# Patient Record
Sex: Female | Born: 1965 | Race: White | Hispanic: No | Smoking: Never smoker
Health system: Southern US, Community
[De-identification: ages and names within clinical notes are randomized; demographics above are authoritative.]

## PROBLEM LIST (undated history)

## (undated) DIAGNOSIS — F419 Anxiety disorder, unspecified: Secondary | ICD-10-CM

## (undated) DIAGNOSIS — G43909 Migraine, unspecified, not intractable, without status migrainosus: Secondary | ICD-10-CM

---

## 1998-07-17 ENCOUNTER — Emergency Department (HOSPITAL_COMMUNITY): Admission: EM | Admit: 1998-07-17 | Discharge: 1998-07-17 | Payer: Self-pay | Admitting: Emergency Medicine

## 1999-07-03 ENCOUNTER — Emergency Department (HOSPITAL_COMMUNITY): Admission: EM | Admit: 1999-07-03 | Discharge: 1999-07-04 | Payer: Self-pay | Admitting: Emergency Medicine

## 1999-08-14 ENCOUNTER — Emergency Department (HOSPITAL_COMMUNITY): Admission: EM | Admit: 1999-08-14 | Discharge: 1999-08-14 | Payer: Self-pay | Admitting: Emergency Medicine

## 1999-09-01 ENCOUNTER — Encounter: Admission: RE | Admit: 1999-09-01 | Discharge: 1999-09-01 | Payer: Self-pay | Admitting: Urology

## 2000-05-04 ENCOUNTER — Encounter: Payer: Self-pay | Admitting: Family Medicine

## 2000-05-04 ENCOUNTER — Encounter: Admission: RE | Admit: 2000-05-04 | Discharge: 2000-05-04 | Payer: Self-pay | Admitting: Family Medicine

## 2000-11-03 ENCOUNTER — Other Ambulatory Visit: Admission: RE | Admit: 2000-11-03 | Discharge: 2000-11-03 | Payer: Self-pay | Admitting: Family Medicine

## 2000-12-13 ENCOUNTER — Other Ambulatory Visit: Admission: RE | Admit: 2000-12-13 | Discharge: 2000-12-13 | Payer: Self-pay | Admitting: Obstetrics and Gynecology

## 2000-12-13 ENCOUNTER — Encounter (INDEPENDENT_AMBULATORY_CARE_PROVIDER_SITE_OTHER): Payer: Self-pay | Admitting: Specialist

## 2002-03-17 ENCOUNTER — Emergency Department (HOSPITAL_COMMUNITY): Admission: EM | Admit: 2002-03-17 | Discharge: 2002-03-17 | Payer: Self-pay

## 2003-02-22 ENCOUNTER — Emergency Department (HOSPITAL_COMMUNITY): Admission: EM | Admit: 2003-02-22 | Discharge: 2003-02-22 | Payer: Self-pay | Admitting: Emergency Medicine

## 2003-07-17 ENCOUNTER — Emergency Department (HOSPITAL_COMMUNITY): Admission: EM | Admit: 2003-07-17 | Discharge: 2003-07-17 | Payer: Self-pay | Admitting: Emergency Medicine

## 2003-07-20 ENCOUNTER — Emergency Department (HOSPITAL_COMMUNITY): Admission: EM | Admit: 2003-07-20 | Discharge: 2003-07-20 | Payer: Self-pay | Admitting: Emergency Medicine

## 2004-02-06 ENCOUNTER — Emergency Department (HOSPITAL_COMMUNITY): Admission: EM | Admit: 2004-02-06 | Discharge: 2004-02-06 | Payer: Self-pay | Admitting: Emergency Medicine

## 2004-08-04 ENCOUNTER — Emergency Department (HOSPITAL_COMMUNITY): Admission: EM | Admit: 2004-08-04 | Discharge: 2004-08-04 | Payer: Self-pay | Admitting: Emergency Medicine

## 2005-01-21 ENCOUNTER — Emergency Department (HOSPITAL_COMMUNITY): Admission: EM | Admit: 2005-01-21 | Discharge: 2005-01-21 | Payer: Self-pay | Admitting: Emergency Medicine

## 2005-02-21 ENCOUNTER — Emergency Department (HOSPITAL_COMMUNITY): Admission: EM | Admit: 2005-02-21 | Discharge: 2005-02-21 | Payer: Self-pay | Admitting: Emergency Medicine

## 2006-03-11 ENCOUNTER — Emergency Department (HOSPITAL_COMMUNITY): Admission: EM | Admit: 2006-03-11 | Discharge: 2006-03-11 | Payer: Self-pay | Admitting: Emergency Medicine

## 2006-11-25 ENCOUNTER — Emergency Department (HOSPITAL_COMMUNITY): Admission: EM | Admit: 2006-11-25 | Discharge: 2006-11-25 | Payer: Self-pay | Admitting: Emergency Medicine

## 2006-11-27 ENCOUNTER — Inpatient Hospital Stay (HOSPITAL_COMMUNITY): Admission: AD | Admit: 2006-11-27 | Discharge: 2006-11-27 | Payer: Self-pay | Admitting: Obstetrics and Gynecology

## 2006-11-27 ENCOUNTER — Encounter (INDEPENDENT_AMBULATORY_CARE_PROVIDER_SITE_OTHER): Payer: Self-pay | Admitting: Specialist

## 2006-11-27 ENCOUNTER — Ambulatory Visit: Payer: Self-pay | Admitting: Obstetrics & Gynecology

## 2007-01-29 ENCOUNTER — Emergency Department (HOSPITAL_COMMUNITY): Admission: EM | Admit: 2007-01-29 | Discharge: 2007-01-29 | Payer: Self-pay | Admitting: Emergency Medicine

## 2007-02-01 ENCOUNTER — Emergency Department (HOSPITAL_COMMUNITY): Admission: EM | Admit: 2007-02-01 | Discharge: 2007-02-01 | Payer: Self-pay | Admitting: Emergency Medicine

## 2007-04-25 ENCOUNTER — Emergency Department (HOSPITAL_COMMUNITY): Admission: EM | Admit: 2007-04-25 | Discharge: 2007-04-25 | Payer: Self-pay | Admitting: Emergency Medicine

## 2007-06-24 ENCOUNTER — Emergency Department (HOSPITAL_COMMUNITY): Admission: EM | Admit: 2007-06-24 | Discharge: 2007-06-24 | Payer: Self-pay | Admitting: *Deleted

## 2007-08-24 ENCOUNTER — Encounter (INDEPENDENT_AMBULATORY_CARE_PROVIDER_SITE_OTHER): Payer: Self-pay | Admitting: Obstetrics and Gynecology

## 2007-08-24 ENCOUNTER — Ambulatory Visit: Payer: Self-pay | Admitting: Vascular Surgery

## 2007-08-24 ENCOUNTER — Ambulatory Visit (HOSPITAL_COMMUNITY): Admission: RE | Admit: 2007-08-24 | Discharge: 2007-08-24 | Payer: Self-pay | Admitting: Obstetrics and Gynecology

## 2007-12-14 ENCOUNTER — Inpatient Hospital Stay (HOSPITAL_COMMUNITY): Admission: AD | Admit: 2007-12-14 | Discharge: 2007-12-14 | Payer: Self-pay | Admitting: Obstetrics and Gynecology

## 2007-12-15 ENCOUNTER — Inpatient Hospital Stay (HOSPITAL_COMMUNITY): Admission: AD | Admit: 2007-12-15 | Discharge: 2007-12-20 | Payer: Self-pay | Admitting: Obstetrics and Gynecology

## 2007-12-18 ENCOUNTER — Encounter (HOSPITAL_COMMUNITY): Payer: Self-pay | Admitting: Obstetrics and Gynecology

## 2008-01-25 ENCOUNTER — Ambulatory Visit (HOSPITAL_COMMUNITY): Admission: RE | Admit: 2008-01-25 | Discharge: 2008-01-25 | Payer: Self-pay | Admitting: Obstetrics and Gynecology

## 2008-02-01 ENCOUNTER — Ambulatory Visit: Payer: Self-pay | Admitting: Cardiology

## 2008-03-17 ENCOUNTER — Emergency Department (HOSPITAL_COMMUNITY): Admission: EM | Admit: 2008-03-17 | Discharge: 2008-03-17 | Payer: Self-pay | Admitting: Emergency Medicine

## 2009-01-09 ENCOUNTER — Emergency Department (HOSPITAL_COMMUNITY): Admission: EM | Admit: 2009-01-09 | Discharge: 2009-01-09 | Payer: Self-pay | Admitting: Emergency Medicine

## 2009-05-07 IMAGING — US US OB COMP +14 WK
1 series · 14 of 28 positions shown · non-contrast
Comparison: none

OBSTETRICAL ULTRASOUND:

 This ultrasound exam was performed in the [HOSPITAL] Ultrasound Department.  The OB US report was generated in the AS system, and faxed to the ordering physician.  This report is also available in [REDACTED] PACS.

[Series 1: us fetal bpp w/o nonstress · non-contrast · 14 of 34 slices shown]
[im 2/34]
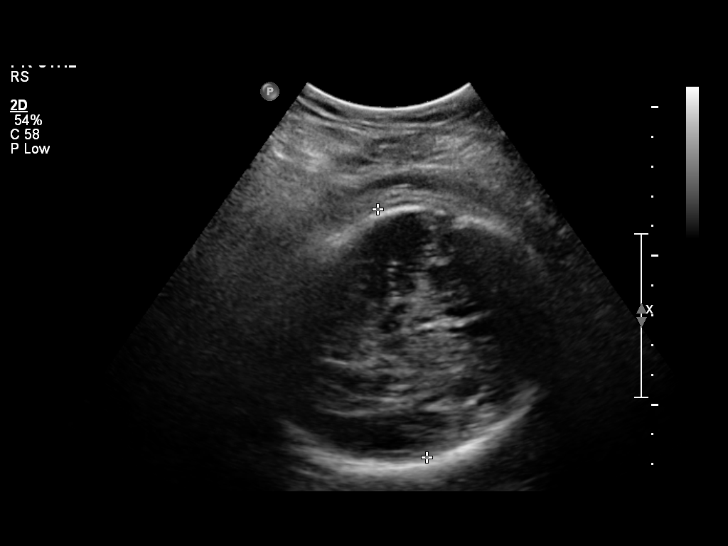
[im 4/34]
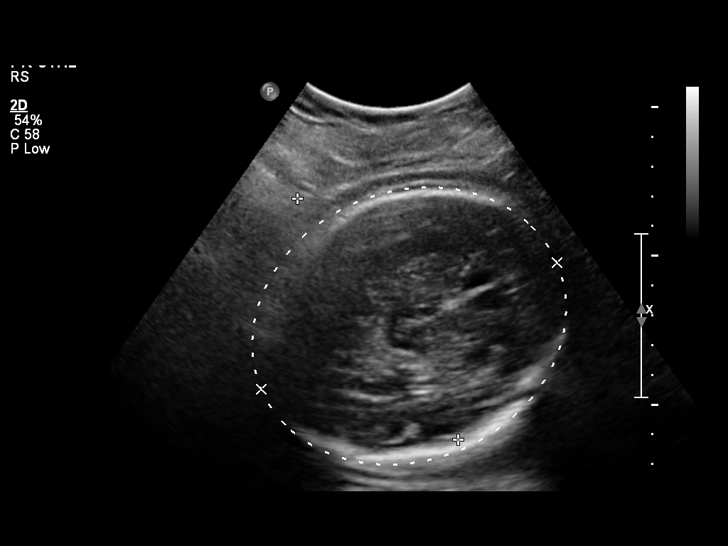
[im 7/34]
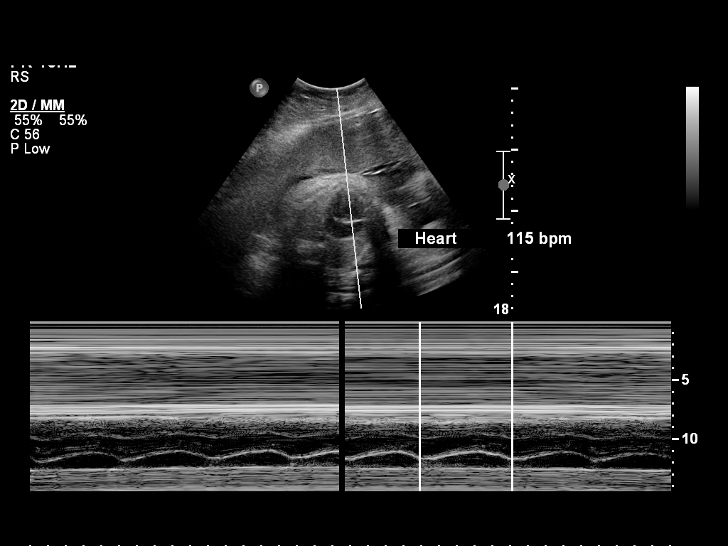
[im 9/34]
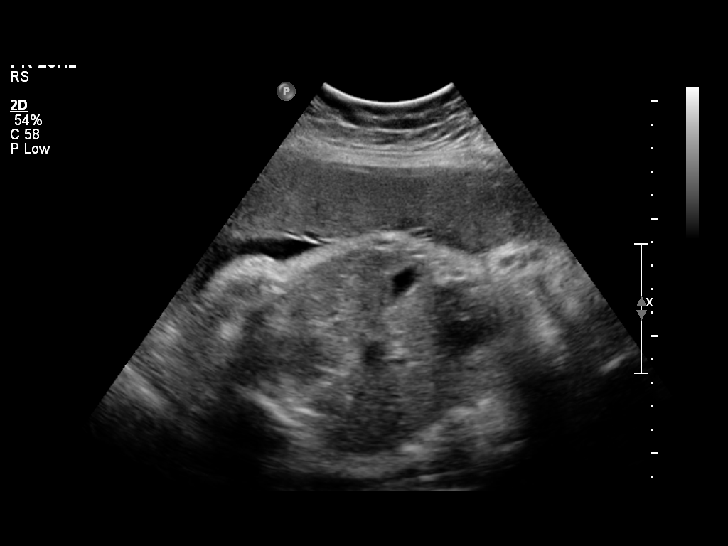
[im 12/34]
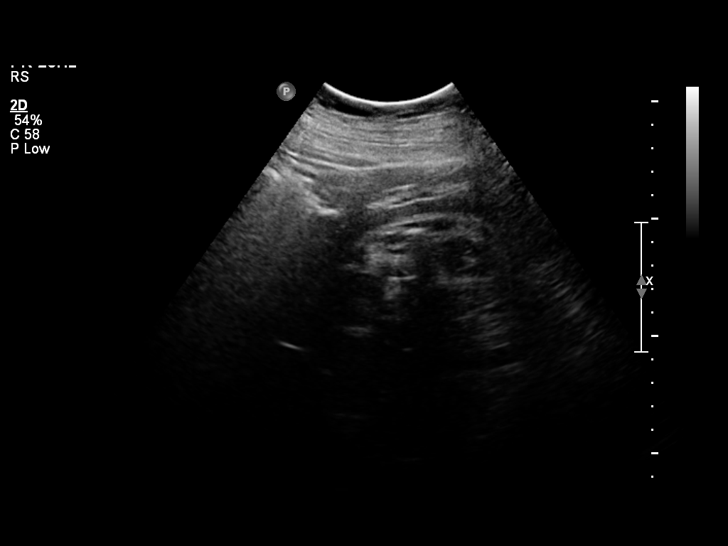
[im 14/34]
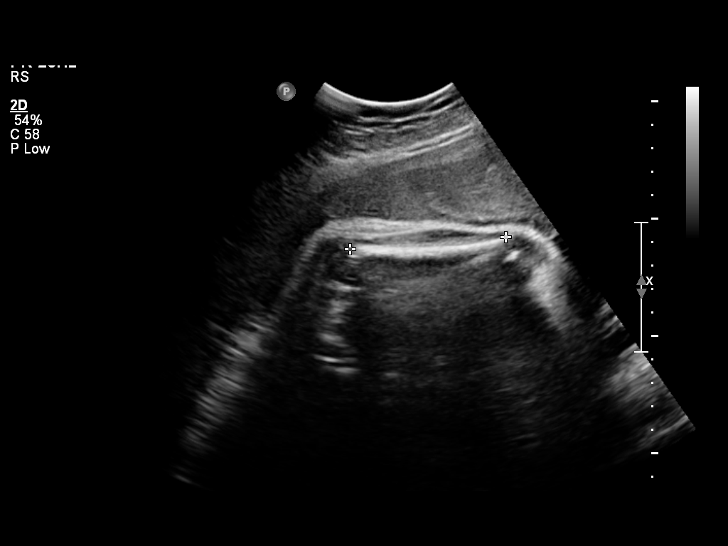
[im 16/34]
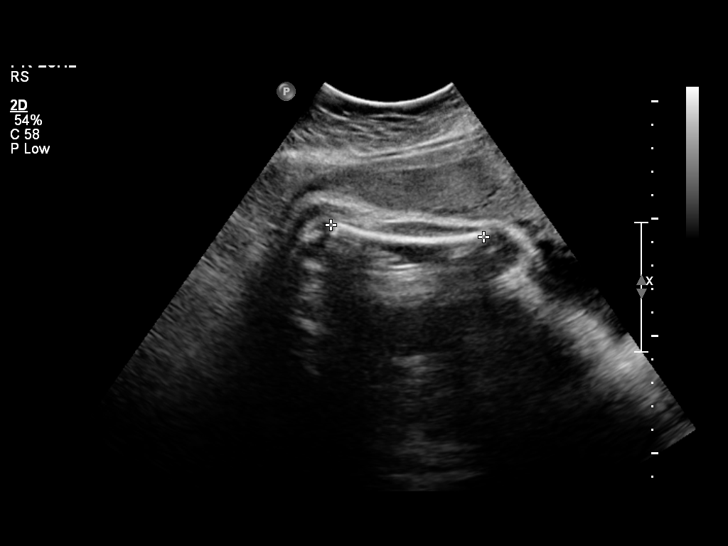
[im 19/34]
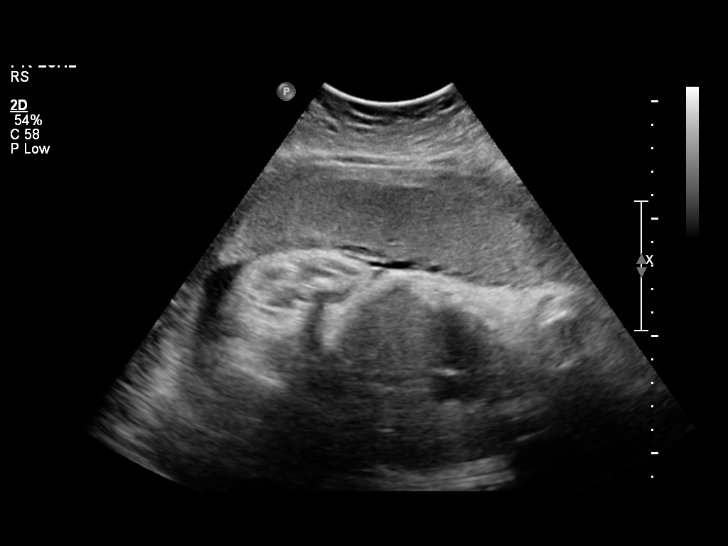
[im 21/34]
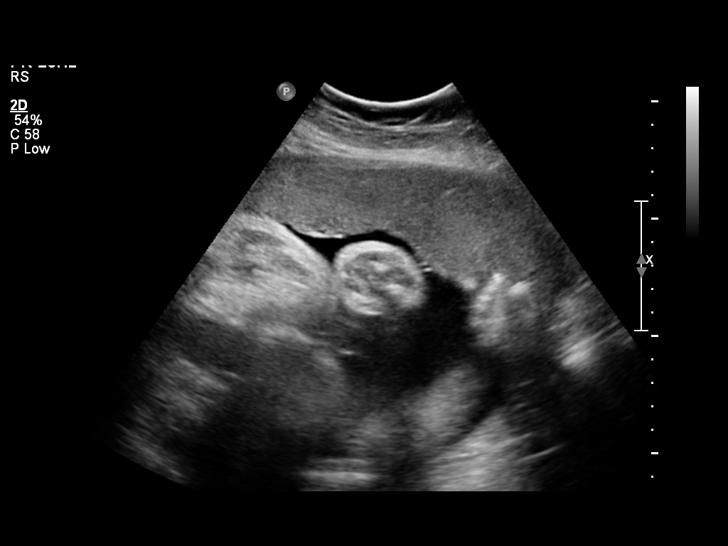
[im 24/34]
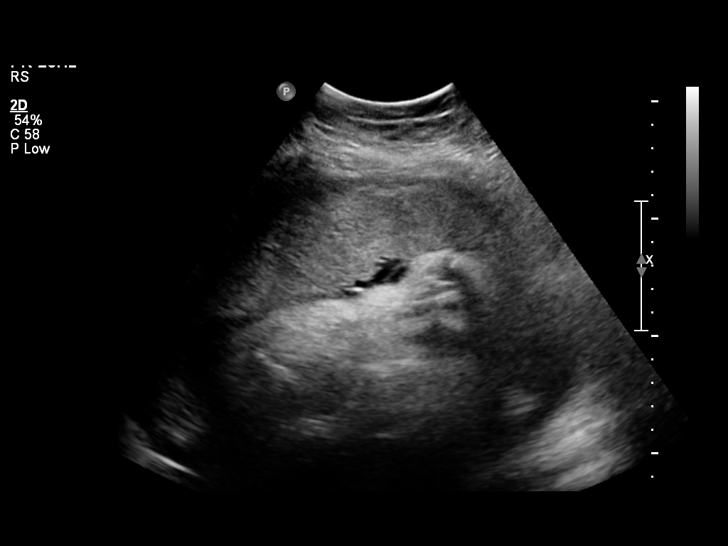
[im 26/34]
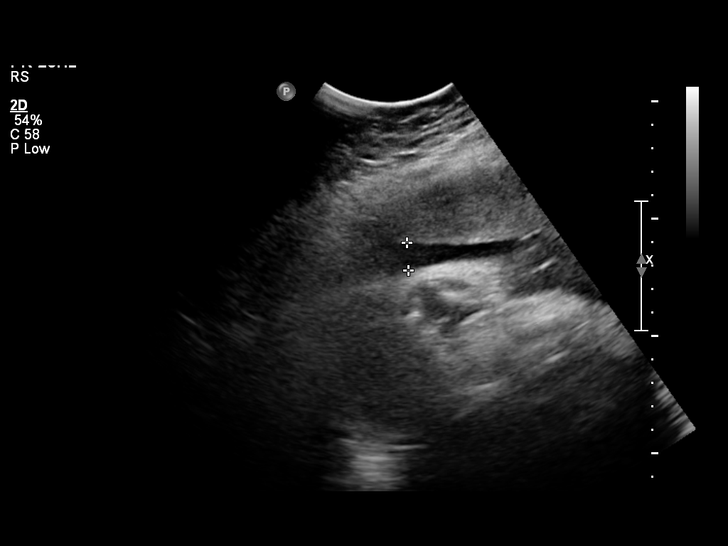
[im 29/34]
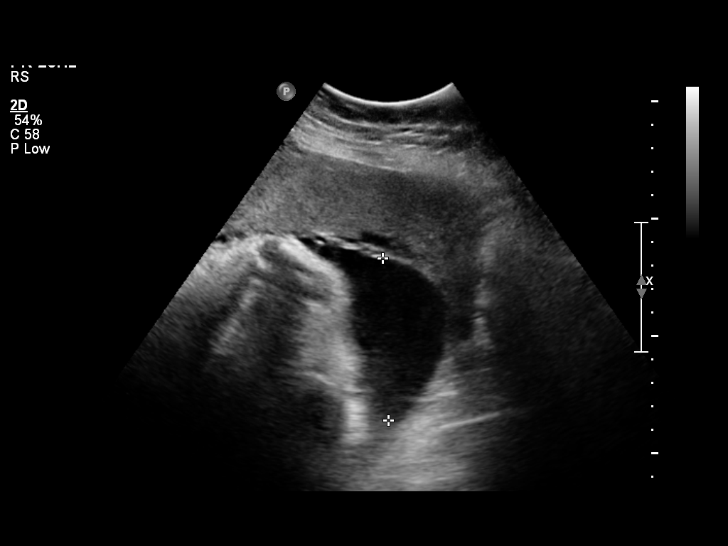
[im 31/34]
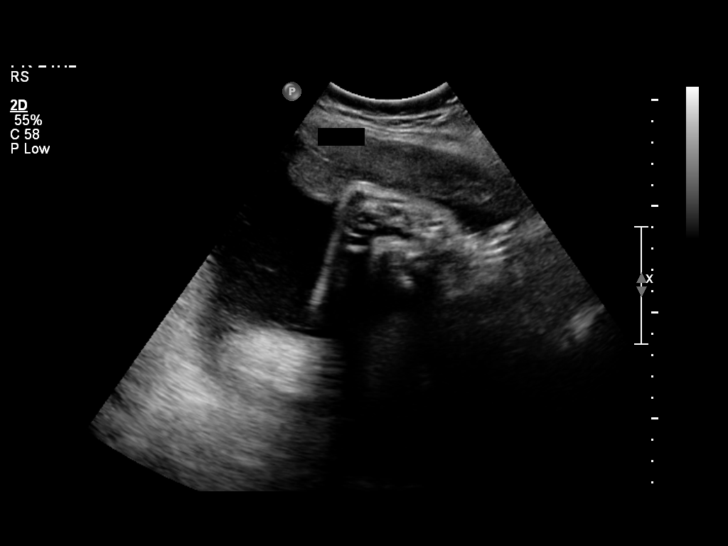
[im 34/34]
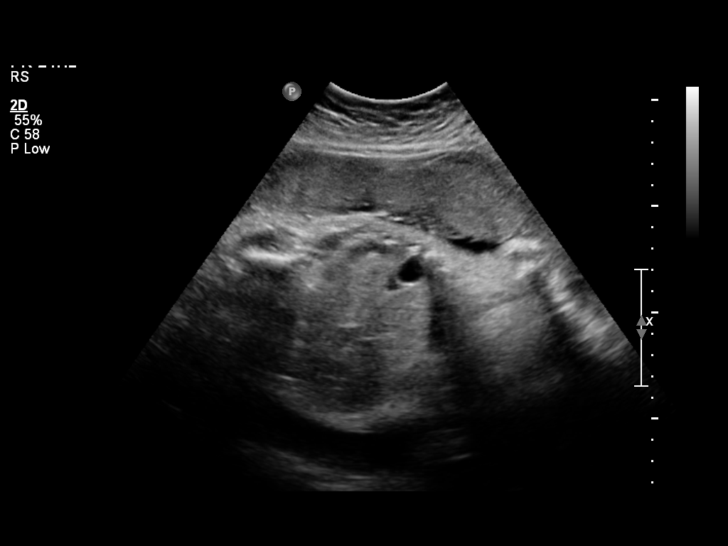

[14 of 28 positions shown; findings below may reference images not displayed]

IMPRESSION: See AS Obstetric US report.

## 2011-04-05 NOTE — Op Note (Signed)
Samantha Wolfe, HEACOX          ACCOUNT NO.:  [id number]   MEDICAL RECORD NO.:  [id number]          PATIENT TYPE:  AMB   LOCATION:  SDC                           FACILITY:  [facility]   PHYSICIAN:  Juluis Mire, M.D.   DATE OF BIRTH:  01-28-1966   DATE OF PROCEDURE:  01/25/2008  DATE OF DISCHARGE:  01/25/2008                               OPERATIVE REPORT   PREOPERATIVE DIAGNOSIS:  Multiparity, desires sterility.   POSTOPERATIVE DIAGNOSIS:  Multiparity, desires sterility.   OPERATIVE PROCEDURE:  Laparoscopy with bilateral tubal fulguration.   SURGEON:  Juluis Mire, M.D.   ANESTHESIA:  General.   ESTIMATED BLOOD LOSS:  Minimal.   PACKS AND DRAINS:  None.   INTRAOPERATIVE BLOOD REPLACEMENT:  None.   COMPLICATIONS:  None.   INDICATIONS:  The indications are dictated in history and physical.   PROCEDURE:  The patient was taken to the OR and placed in a supine  position.  After satisfactory level of general endotracheal anesthesia  was obtained, the patient was placed in the dorsal lithotomy position  using the Allen stirrups.  Perineum and vagina were cleansed out with  Betadine.  Bladder was emptied by in-and-out catheterization.  A Hulka  tacked was put in place and secured.  The patient was draped as a  sterile field.  A subumbilical incision was made with a knife.  A Veress  needle was introduced into the abdominal cavity.  Abdomen was  insufflated with approximately 3 L of carbon dioxide.  The operating  laparoscope was then introduced.  There was no evidence of injury to  adjacent organs.  Visualization revealed the uterus to be of normal size  and shape.  Tubes and ovary were unremarkable.  Both lateral gutters and  upper abdomen were clear.  The appendix must have been retrocecal; I  could see it.  I could see the edge of the gallbladder and it was  normal.  Using the bipolar, a midsegment tube was coagulated for a  distance of 3 cm.  Coagulation was continued each  time until resistance  read 0.  We then we recoagulated the same segment of tube, completely  desiccating the tube.  Coagulation did extend out to the mesosalpinx.  Both tubes were coagulated.  There was no active bleeding or signs of  injury to adjacent organs.  The abdomen was deflated of its carbon  dioxide.  The trocar was removed.  The subumbilical incision was closed  with  interrupted subcuticulars of 4-0 Vicryl.  The Hulka tenaculum was then  removed.  The patient was taken out of the dorsal lithotomy position,  once alert and extubated, transferred to recovery room in good  condition.  Sponge, instrument and needle count was reported as correct  by circulating nurse.      Juluis Mire, M.D.  Electronically Signed     JSM/MEDQ  D:  01/25/2008  T:  01/27/2008  Job:  [id number]

## 2011-04-05 NOTE — H&P (Signed)
Samantha Wolfe, BABY          ACCOUNT NO.:  [id number]   MEDICAL RECORD NO.:  [id number]          PATIENT TYPE:  AMB   LOCATION:  SDC                           FACILITY:  [facility]   PHYSICIAN:  Juluis Mire, M.D.   DATE OF BIRTH:  [birth year]   DATE OF ADMISSION:  01/25/2008  DATE OF DISCHARGE:                              HISTORY & PHYSICAL   HISTORY OF PRESENT ILLNESS:  The patient is a 45 year old, G3, P2, AB1  female who presents for permanent sterilization.  Alternative forms of  birth control have been discussed.  The potential irreversibility of  sterilization was explained.  A failure rate of 1-200 was quoted.  Failures can be in the form of ectopic pregnancy requiring surgical  management.   ALLERGIES:  COMPAZINE, AMBIEN AND ASPIRIN.   PAST MEDICAL HISTORY:  History of chronic hypertension for which she is  presently on Norvasc and hydrochlorothiazide.  Otherwise, usual  childhood diseases.   PAST SURGICAL HISTORY:  Kidney stone removed in 1997.   OBSTETRICAL HISTORY:  She has had two vaginal deliveries, one  spontaneous abortion.   SOCIAL HISTORY:  Father with a history of lung cancer.  Mother with a  history of chronic hypertension.   SOCIAL HISTORY:  Reveals no tobacco or alcohol use.   REVIEW OF SYSTEMS:  Noncontributory.   PHYSICAL EXAMINATION:  VITAL SIGNS:  Stable.  HEENT:  The patient is normocephalic.  Pupils equal, round and react to  light and accommodation.  Extraocular were intact.  Sclerae are clear.  Oropharynx clear.  NECK:  Without thyromegaly.  BREASTS:  No discrete masses.  LUNGS:  Clear.  CARDIAC:  Regular rate without murmurs or gallops.  ABDOMEN:  Benign.  No mass, organomegaly or tenderness.  PELVIC:  Normal external genitalia.  Vaginal mucosa is clear.  Cervix  unremarkable.  Uterus normal size, shape and contour.  Adnexa free of  masses or tenderness.  EXTREMITIES:  Trace edema.  NEUROLOGIC:  Grossly within normal limits.   IMPRESSION:  1. Multiparity, desires sterility.  2. Chronic hypertension.   PLAN:  The patient to undergo laparoscopic bilateral tubal ligation.  The risks of surgery have been discussed including the risk of  infection.  Risk of hemorrhage could require transfusion with the risk  of AIDS or hepatitis.  Risk of injury to adjacent organs including  bladder, bowel or ureters could require further exploratory surgery.  Risk of deep venous thrombosis and pulmonary embolus.  The patient  expressed understanding of indications and risks.      Juluis Mire, M.D.  Electronically Signed     JSM/MEDQ  D:  01/25/2008  T:  01/26/2008  Job:  [id number]

## 2011-04-08 NOTE — Discharge Summary (Signed)
Samantha Wolfe, Samantha Wolfe          ACCOUNT NO.:  [id number]   MEDICAL RECORD NO.:  [id number]          PATIENT TYPE:  INP   LOCATION:  9309                          FACILITY:  [facility]   PHYSICIAN:  Valene L. Grewal, M.D.DATE OF BIRTH:  Oct 31, 1966   DATE OF ADMISSION:  12/15/2007  DATE OF DISCHARGE:  12/20/2007                               DISCHARGE SUMMARY   ADMITTING DIAGNOSES:  1. Intrauterine pregnancy at 33-2/7 weeks estimated gestational age.  2. Chronic hypertension.  3. Questionable superimposed pregnancy-induced hypertension.   DISCHARGE DIAGNOSES:  1. Status post spontaneous vaginal delivery.  2. Viable female infant.   PROCEDURES:  Spontaneous vaginal delivery.   REASON FOR ADMISSION:  Please see written H and P.   HOSPITAL COURSE:  The patient is 45 year old gravida 3, para 1-0-1-1  that was admitted to [facility] at 33-2/7 weeks  estimated gestational age with complaints of nausea and headache.  The  patient did have known chronic hypertension, currently on labetalol 300  mg t.i.d.  The patient had been seen in the MAU several days prior to  admission for  hypertension where [facility] labs had been drawn which were  normal.  Biophysical profile had resulted a 6/8.  The patient had  returned with complaints of headache and nausea with blood pressure  known to be 195/100.  The patient was given Procardia 10 mg times one  and blood pressure did decrease.  PIH labs were rechecked and were  within normal limits.  The patient was then admitted for observation.  Several hours later the patient did spontaneously rupture her membranes  which revealed clear fluid which was confirmed.  She was now placed on  magnesium sulfate, Unasyn and dexamethasone for enhancement of fetal  lung maturity.  On the following morning the patient reported that  nausea and headache had resolved.  She denied uterine contractions,  vaginal bleeding, fever or abdominal pain.  Blood  pressure was 108/68.  Vital signs were stable.  She was afebrile.  Fetal heart tones reactive  without deceleration.  Tocometer did not reveal any uterine  contractions.  Magnesium was at 2 grams per hour.  PIH labs were within  normal limits and hemoglobin was known to be 9.1.  Extensive discussion  was made with the patient and her mother regarding the plan of care and  she was to continue on magnesium sulfate for least 48 hours.  NICU  consult was made.  A 24-hour urine was started for total protein and  creatinine clearance.  On the following morning the patient continued to  be without contractions.  Fetal heart tones were in the 120s with  acceleration.  The patient continued on IV antibiotics.  Blood pressure  was 120/80.  Ultrasound was performed for fetal growth and she continued  on plan for bedrest and magnesium was to be discontinued today.  The  patient was somewhat unhappy with the plan of care and fetal internal  medicine consult was made for second opinion.  Later that evening the  patient was without complaint.  Vital signs remained stable.  She denied  any contractions or vaginal  bleeding.  Fluid continued to be clear.  Fetal heart tones were reassuring.  Transabdominal ultrasound revealed  vertex weighing 2888 grams which is greater than ninetieth percentile.  Later that evening the patient did start to experience some  contractions.  The cervix was examined and found to be now 4-6 cm  dilated and fetal heart tones continued to be reassuring and  contractions were noted to be approximately every 2-4 minutes.  The  patient was now transferred to labor and delivery.  NICU was notified.  Ampicillin was given for group B beta strep prophylaxis.  The patient  did quickly achieve complete dilatation with vertex at +2 station.  She  then spontaneously delivered a viable female infant with Apgars of 7 at  one minute and 9 at five and the NICU team was present for the delivery.   A nuchal cord was easily reduced and the placenta was delivered  spontaneously intact with a three-vessel cord.  The patient did not  experience any lacerations.  Estimated blood loss was 350 mL.  Fundus  was firm and hemostatic.  Later that morning the patient was transferred  to the mother baby unit.  She denied headache or blurred vision or right  upper quadrant pain.  Baby was stable in the NICU on room air.  Vital  signs were stable.  She was afebrile.  Blood pressure was 166/89 to  165/77.  Deep tendon reflexes 1+, no clonus.  Fundus remained firm and  nontender.  Perineum was intact.  Laboratory findings revealed  hemoglobin of 8.2, platelet count 186,000, WBC count of 16.1.  On  postpartum day #1 the patient complained of a cold sore.  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  She continued to deny headache or  blurred vision or right upper quadrant pain.  Vital signs were stable.  She was afebrile.  Blood pressure 156/80 to 162/87.  Fundus remained  firm, nontender and perineum was intact.  Repeat labs revealed  hemoglobin of 7.7, platelet count 155,000, WBC of 11.9.  The patient was  started on some Valtrex and PIH labs were ordered for the following  morning.  The NICU physician was consulted and the patient did have  consultation with the nurses and the director of NICU regarding feeding  issues.  Clarification was made with the patient and her boyfriend and  social worker was to continue to follow them in order to continue with  communication.  On postpartum day #2 the patient was without complaint.  Vital signs were stable.  Blood pressure 160/92, temperature 98.4,  uterus nontender.  Small amount of lochia.  Discharge instructions were  reviewed and the patient was later discharged home.   CONDITION ON DISCHARGE:  Stable.   DIET:  Regular as tolerated.   ACTIVITY:  No vaginal entry.    FOLLOW UP:  Patient to follow up in the office in 1 week.  She is to  call for heavy vaginal bleeding, fever, chills or difficulty with  voiding.   DISCHARGE MEDICATIONS:  1. Ibuprofen 600 mg every 6 hours.  2. Tylox #30 one p.o. every 4 hours.  3. Labetalol 300 mg one p.o. t.i.d.      Julio Sicks, N.P.      Stann Mainland. Vincente Poli, M.D.  Electronically Signed    CC/MEDQ  D:  12/30/2007  T:  12/31/2007  Job:  [id number]

## 2011-08-11 LAB — COMPREHENSIVE METABOLIC PANEL
ALT: 13
AST: 18
AST: 19
AST: 22
Albumin: 2 — ABNORMAL LOW
Albumin: 2.2 — ABNORMAL LOW
Alkaline Phosphatase: 119 — ABNORMAL HIGH
Alkaline Phosphatase: 121 — ABNORMAL HIGH
Alkaline Phosphatase: 126 — ABNORMAL HIGH
Alkaline Phosphatase: 126 — ABNORMAL HIGH
BUN: 11
BUN: 11
BUN: 7
BUN: 9
BUN: 9
CO2: 23
CO2: 26
Calcium: 6.5 — ABNORMAL LOW
Calcium: 8.6
Calcium: 9
Chloride: 101
Chloride: 102
Chloride: 105
Creatinine, Ser: 0.65
Creatinine, Ser: 0.67
Creatinine, Ser: 0.7
GFR calc Af Amer: >60
GFR calc Af Amer: >60
GFR calc Af Amer: >60
GFR calc non Af Amer: >60
GFR calc non Af Amer: >60
GFR calc non Af Amer: >60
GFR calc non Af Amer: >60
Glucose, Bld: 103 — ABNORMAL HIGH
Glucose, Bld: 118 — ABNORMAL HIGH
Glucose, Bld: 90
Potassium: 3.2 — ABNORMAL LOW
Potassium: 3.6
Potassium: 4
Sodium: 136
Total Bilirubin: 0.3
Total Bilirubin: 0.3
Total Bilirubin: 0.5
Total Bilirubin: 0.5
Total Bilirubin: 0.6
Total Protein: 6.1
Total Protein: 6.4

## 2011-08-11 LAB — LACTATE DEHYDROGENASE
LDH: 134
LDH: 163
LDH: 199

## 2011-08-11 LAB — URINALYSIS, ROUTINE W REFLEX MICROSCOPIC
Bilirubin Urine: NEGATIVE
Glucose, UA: NEGATIVE
Nitrite: NEGATIVE
Protein, ur: NEGATIVE
Specific Gravity, Urine: 1.025
pH: 5

## 2011-08-11 LAB — CBC
HCT: 23.5 — ABNORMAL LOW
HCT: 24.6 — ABNORMAL LOW
HCT: 26.7 — ABNORMAL LOW
HCT: 27 — ABNORMAL LOW
HCT: 29.9 — ABNORMAL LOW
Hemoglobin: 9 — ABNORMAL LOW
MCHC: 33.4
MCV: 78.4
MCV: 78.5
Platelets: 155
Platelets: 186
Platelets: 238
Platelets: 239
RBC: 2.96 — ABNORMAL LOW
RBC: 3.44 — ABNORMAL LOW
RBC: 3.81 — ABNORMAL LOW
RDW: 18.1 — ABNORMAL HIGH
WBC: 10.1
WBC: 11.9 — ABNORMAL HIGH
WBC: 13 — ABNORMAL HIGH
WBC: 16.1 — ABNORMAL HIGH

## 2011-08-11 LAB — URINE MICROSCOPIC-ADD ON

## 2011-08-11 LAB — DIFFERENTIAL
Basophils Absolute: 0
Basophils Relative: 0
Lymphocytes Relative: 10 — ABNORMAL LOW
Monocytes Absolute: 1

## 2011-08-11 LAB — URIC ACID
Uric Acid, Serum: 5.2
Uric Acid, Serum: 5.8
Uric Acid, Serum: 6.2
Uric Acid, Serum: 6.4

## 2011-08-15 LAB — BASIC METABOLIC PANEL
Calcium: 9.1
GFR calc Af Amer: >60
GFR calc non Af Amer: >60
Glucose, Bld: 100 — ABNORMAL HIGH
Sodium: 134 — ABNORMAL LOW

## 2011-08-15 LAB — POTASSIUM: Potassium: 3.2 — ABNORMAL LOW

## 2011-08-15 LAB — CBC
HCT: 37.6
Platelets: 244
RDW: 18.8 — ABNORMAL HIGH

## 2011-08-15 LAB — HCG, SERUM, QUALITATIVE: Preg, Serum: NEGATIVE

## 2016-02-03 ENCOUNTER — Emergency Department (HOSPITAL_COMMUNITY)
Admission: EM | Admit: 2016-02-03 | Discharge: 2016-02-03 | Disposition: A | Discharge status: Home / Self Care | Payer: Self-pay | Attending: Physician Assistant | Admitting: Physician Assistant

## 2016-02-03 DIAGNOSIS — R51 Headache: Secondary | ICD-10-CM | POA: Insufficient documentation

## 2016-02-03 DIAGNOSIS — J020 Streptococcal pharyngitis: Secondary | ICD-10-CM

## 2016-02-03 MED ORDER — PENICILLIN G BENZATHINE 1200000 UNIT/2ML IM SUSP
1.2000 10*6.[IU] | Freq: Once | INTRAMUSCULAR | Status: DC
  Filled 2016-02-03: qty 2

## 2016-02-03 MED ORDER — KETOROLAC TROMETHAMINE 60 MG/2ML IM SOLN
60.0000 mg | Freq: Once | INTRAMUSCULAR | Status: AC
  Administered 2016-02-03: 60 mg via INTRAMUSCULAR
  Filled 2016-02-03: qty 2

## 2016-02-03 MED ORDER — DEXAMETHASONE SODIUM PHOSPHATE 10 MG/ML IJ SOLN
10.0000 mg | Freq: Once | INTRAMUSCULAR | Status: AC
  Administered 2016-02-03: 10 mg via INTRAMUSCULAR
  Filled 2016-02-03: qty 1

## 2016-02-03 MED ORDER — METOCLOPRAMIDE HCL 5 MG/ML IJ SOLN
10.0000 mg | Freq: Once | INTRAMUSCULAR | Status: AC
  Administered 2016-02-03: 10 mg via INTRAMUSCULAR
  Filled 2016-02-03: qty 2

## 2016-02-03 MED ORDER — AMOXICILLIN 250 MG/5ML PO SUSR
875.0000 mg | Freq: Once | ORAL | Status: DC
  Filled 2016-02-03: qty 20

## 2016-02-03 MED ORDER — AMOXICILLIN 400 MG/5ML PO SUSR: 875.0000 mg | Freq: Two times a day (BID) | ORAL | Status: AC

## 2016-02-03 NOTE — ED Notes (Signed)
Pt has had a cough for the last week with fever off and on.  C/O pain 10/10 in throat and ShOB

## 2016-02-03 NOTE — Discharge Instructions (Signed)
Strep Throat °Strep throat is a bacterial infection of the throat. Your health care provider may call the infection tonsillitis or pharyngitis, depending on whether there is swelling in the tonsils or at the back of the throat. Strep throat is most common during the cold months of the year in children who are 5-48 years of age, but it can happen during any season in people of any age. This infection is spread from person to person (contagious) through coughing, sneezing, or close contact. °CAUSES °Strep throat is caused by the bacteria called Streptococcus pyogenes. °RISK FACTORS °This condition is more likely to develop in: °· People who spend time in crowded places where the infection can spread easily. °· People who have close contact with someone who has strep throat. °SYMPTOMS °Symptoms of this condition include: °· Fever or chills.   °· Redness, swelling, or pain in the tonsils or throat. °· Pain or difficulty when swallowing. °· White or yellow spots on the tonsils or throat. °· Swollen, tender glands in the neck or under the jaw. °· Red rash all over the body (rare). °DIAGNOSIS °This condition is diagnosed by performing a rapid strep test or by taking a swab of your throat (throat culture test). Results from a rapid strep test are usually ready in a few minutes, but throat culture test results are available after one or two days. °TREATMENT °This condition is treated with antibiotic medicine. °HOME CARE INSTRUCTIONS °Medicines °· Take over-the-counter and prescription medicines only as told by your health care provider. °· Take your antibiotic as told by your health care provider. Do not stop taking the antibiotic even if you start to feel better. °· Have family members who also have a sore throat or fever tested for strep throat. They may need antibiotics if they have the strep infection. °Eating and Drinking °· Do not share food, drinking cups, or personal items that could cause the infection to spread to  other people. °· If swallowing is difficult, try eating soft foods until your sore throat feels better. °· Drink enough fluid to keep your urine clear or pale yellow. °General Instructions °· Gargle with a salt-water mixture 3-4 times per day or as needed. To make a salt-water mixture, completely dissolve ½-1 tsp of salt in 1 cup of warm water. °· Make sure that all household members wash their hands well. °· Get plenty of rest. °· Stay home from school or work until you have been taking antibiotics for 24 hours. °· Keep all follow-up visits as told by your health care provider. This is important. °SEEK MEDICAL CARE IF: °· The glands in your neck continue to get bigger. °· You develop a rash, cough, or earache. °· You cough up a thick liquid that is green, yellow-brown, or bloody. °· You have pain or discomfort that does not get better with medicine. °· Your problems seem to be getting worse rather than better. °· You have a fever. °SEEK IMMEDIATE MEDICAL CARE IF: °· You have new symptoms, such as vomiting, severe headache, stiff or painful neck, chest pain, or shortness of breath. °· You have severe throat pain, drooling, or changes in your voice. °· You have swelling of the neck, or the skin on the neck becomes red and tender. °· You have signs of dehydration, such as fatigue, dry mouth, and decreased urination. °· You become increasingly sleepy, or you cannot wake up completely. °· Your joints become red or painful. °  °This information is not intended to replace   advice given to you by your health care provider. Make sure you discuss any questions you have with your health care provider.  Follow-up with your primary care provider for reevaluation. Encourage adequate hydration, drink plenty of fluids. Take antibiotics as prescribed. Return to the emergency department if you experience worsening of your symptoms, difficulty breathing, rash, increased fever.

## 2016-02-03 NOTE — ED Notes (Signed)
Patient having chills and states "I'm cold", PA notified, provided pt with blanket.

## 2016-02-03 NOTE — ED Provider Notes (Signed)
CSN: [id number]     Arrival date & time 02/03/16  1156 History  By signing my name below, I, Tanda Rockers, attest that this documentation has been prepared under the direction and in the presence of [employer], PA-C. Electronically Signed: Tanda Rockers, ED Scribe. 02/03/2016. 1:35 PM.   Chief Complaint  Patient presents with  . Cough   The history is provided by the patient. No language interpreter was used.     HPI Comments: Samantha Wolfe is a 50 y.o. female who presents to the Emergency Department complaining of gradual onset, constant, sore throat x 4 days. Pt also complains of difficulty swallowing, intermittent fever, dry cough, and a headache. She has been taking Tylenol, Theraflu, and Mucinex without relief. Denies any other associated symptoms. Pt has been told she has HTN in the past but is not on any medications for it. Her BP in the ED is 189/110.   No past medical history on file. No past surgical history on file. No family history on file. Social History  Substance Use Topics  . Smoking status: Not on file  . Smokeless tobacco: Not on file  . Alcohol Use: Not on file   OB History    No data available     Review of Systems  Constitutional: Positive for fever.  HENT: Positive for sore throat and trouble swallowing.   Respiratory: Positive for cough.   Neurological: Positive for headaches.  All other systems reviewed and are negative.  Allergies  Review of patient's allergies indicates not on file.  Home Medications   Prior to Admission medications   Not on File   BP 192/115 mmHg  Pulse 87  Temp(Src) 98.4 F (36.9 C) (Oral)  Resp 18  SpO2 100%   Physical Exam  Constitutional: She is oriented to person, place, and time. She appears well-developed and well-nourished. No distress.  HENT:  Head: Normocephalic and atraumatic.  Mouth/Throat: Uvula is midline and mucous membranes are normal. No trismus in the jaw. No uvula swelling. Oropharyngeal  exudate, posterior oropharyngeal edema, posterior oropharyngeal erythema and tonsillar abscesses present.  Eyes: Conjunctivae and EOM are normal. Pupils are equal, round, and reactive to light. Right eye exhibits no discharge. Left eye exhibits no discharge. No scleral icterus.  Neck: Neck supple. No rigidity.  Cardiovascular: Normal rate, regular rhythm, normal heart sounds and intact distal pulses.  Exam reveals no gallop and no friction rub.   No murmur heard. Pulmonary/Chest: Effort normal and breath sounds normal. No respiratory distress. She has no wheezes. She has no rales. She exhibits no tenderness.  Abdominal: Soft. She exhibits no distension. There is no tenderness. There is no guarding.  Musculoskeletal: Normal range of motion. She exhibits no edema.  Lymphadenopathy:    She has cervical adenopathy.  Neurological: She is alert and oriented to person, place, and time.  Strength 5/5 throughout. No sensory deficits.  No gait abnormality.  Skin: Skin is warm and dry. No rash noted. She is not diaphoretic. No erythema. No pallor.  Psychiatric: She has a normal mood and affect. Her behavior is normal.  Nursing note and vitals reviewed.   ED Course  Procedures (including critical care time)  DIAGNOSTIC STUDIES: Oxygen Saturation is 100% on RA, normal by my interpretation.    COORDINATION OF CARE: 1:34 PM-Discussed treatment plan with pt at bedside and pt agreed to plan.   Labs Review Labs Reviewed - No data to display  Imaging Review No results found.   EKG Interpretation None  MDM   Final diagnoses:  Strep throat   Pt with tonsillar exudate, cervical lymphadenopathy, & dysphagia; diagnosis of strep. Treated in the Ed with steroids. Patient allergic to penicillin states that it makes her vomit however she states she is not allergic to amoxicillin. Patient also reports headache while in ED given IM Reglan and Toradol with significant symptomatic improvement. Pt  appears mildly dehydrated, discussed importance of water rehydration. Presentation non concerning for PTA or infxn spread to soft tissue. No trismus or uvula deviation. Specific return precautions discussed. Pt able to drink water in ED without difficulty with intact air way. Recommended PCP follow up. Will discharge with amoxicillin prescription.   I personally performed the services described in this documentation, which was scribed in my presence. The recorded information has been reviewed and is accurate.       Lester KinsmanSamantha Tripp NapavineDowless, PA-C 02/04/16 1553  Courteney Randall AnLyn Mackuen, MD 02/05/16 1009

## 2016-02-03 NOTE — ED Notes (Signed)
Pt did not want to take Amoxicillin dose prior to being discharge, PA notified

## 2016-10-05 ENCOUNTER — Encounter (HOSPITAL_COMMUNITY): Payer: Self-pay

## 2016-10-05 ENCOUNTER — Emergency Department (HOSPITAL_COMMUNITY)
Admission: EM | Admit: 2016-10-05 | Discharge: 2016-10-05 | Disposition: A | Discharge status: Home / Self Care | Payer: BLUE CROSS/BLUE SHIELD | Attending: Emergency Medicine | Admitting: Emergency Medicine

## 2016-10-05 DIAGNOSIS — M79662 Pain in left lower leg: Secondary | ICD-10-CM | POA: Diagnosis not present

## 2016-10-05 DIAGNOSIS — Z7982 Long term (current) use of aspirin: Secondary | ICD-10-CM | POA: Diagnosis not present

## 2016-10-05 DIAGNOSIS — M79605 Pain in left leg: Secondary | ICD-10-CM

## 2016-10-05 DIAGNOSIS — M79661 Pain in right lower leg: Secondary | ICD-10-CM | POA: Insufficient documentation

## 2016-10-05 DIAGNOSIS — Z79899 Other long term (current) drug therapy: Secondary | ICD-10-CM | POA: Insufficient documentation

## 2016-10-05 DIAGNOSIS — M79604 Pain in right leg: Secondary | ICD-10-CM

## 2016-10-05 HISTORY — DX: Migraine, unspecified, not intractable, without status migrainosus: G43.909

## 2016-10-05 HISTORY — DX: Anxiety disorder, unspecified: F41.9

## 2016-10-05 LAB — I-STAT CHEM 8, ED
BUN: 6 mg/dL (ref 6–20)
CHLORIDE: 103 mmol/L (ref 101–111)
CREATININE: 0.8 mg/dL (ref 0.44–1.00)
Calcium, Ion: 1.16 mmol/L (ref 1.15–1.40)
Glucose, Bld: 123 mg/dL — ABNORMAL HIGH (ref 65–99)
HEMATOCRIT: 39 % (ref 36.0–46.0)
HEMOGLOBIN: 13.3 g/dL (ref 12.0–15.0)
POTASSIUM: 3.9 mmol/L (ref 3.5–5.1)
Sodium: 142 mmol/L (ref 135–145)
TCO2: 28 mmol/L (ref 0–100)

## 2016-10-05 MED ORDER — CYCLOBENZAPRINE HCL 10 MG PO TABS: 10.0000 mg | ORAL_TABLET | Freq: Two times a day (BID) | ORAL | 0 refills | Status: AC | PRN

## 2016-10-05 MED ORDER — ACETAMINOPHEN 500 MG PO TABS
1000.0000 mg | ORAL_TABLET | Freq: Once | ORAL | Status: AC
  Administered 2016-10-05: 1000 mg via ORAL
  Filled 2016-10-05: qty 2

## 2016-10-05 MED ORDER — CYCLOBENZAPRINE HCL 10 MG PO TABS
10.0000 mg | ORAL_TABLET | Freq: Once | ORAL | Status: AC
  Administered 2016-10-05: 10 mg via ORAL
  Filled 2016-10-05: qty 1

## 2016-10-05 NOTE — ED Provider Notes (Signed)
[facility]-EMERGENCY DEPT Provider Note   CSN: [id number] Arrival date & time: 10/05/16  1455     History   Chief Complaint Chief Complaint  Patient presents with  . Leg Pain    HPI Samantha Wolfe is a 50 y.o. female.  HPI   Patient is a 50 year old female with history of anxiety and migraines, presents to the emergency room with complaint of bilateral calf and hamstring pain that is been constant for 2 months.  She states that pain is more severe when she first wakes up in the morning and she cannot straighten her legs completely.  Pain is located in her muscles, radiates up and down the back of her legs occasionally to the low buttocks, described as "excruciating", not a burning or stabbing feeling, seems to be exacerbated by various activities, moving, worse after not moving, she states she cannot go to [employer] because she cannot walk secondary to pain.  She denies claudication, edema, erythema, muscle spasms, pallor, injury (new or old). Pt endorses taking narcotic pain medication and SOMA, last dose one week ago, occasionally takes BC powders.  No change to her pain.      Past Medical History:  Diagnosis Date  . Anxiety   . Migraine     There are no active problems to display for this patient.   No past surgical history on file.  OB History    No data available       Home Medications    Prior to Admission medications   Medication Sig Start Date End Date Taking? Authorizing Provider  acetaminophen (TYLENOL) 500 MG tablet Take 500 mg by mouth every 6 (six) hours as needed for mild pain.   Yes Historical Provider, MD  Aspirin-Salicylamide-Caffeine (BC HEADACHE POWDER PO) Take 1 packet by mouth as directed.   Yes Historical Provider, MD  Cyanocobalamin (B-12 PO) Take 1 tablet by mouth every morning.   Yes Historical Provider, MD  ferrous sulfate 325 (65 FE) MG EC tablet Take 325 mg by mouth daily with breakfast.   Yes Historical Provider, MD  Multiple Vitamin  (MULTIVITAMIN WITH MINERALS) TABS tablet Take 1 tablet by mouth daily.   Yes Historical Provider, MD  PRESCRIPTION MEDICATION    Yes Historical Provider, MD    Family History No family history on file.  Social History Social History  Substance Use Topics  . Smoking status: Never Smoker  . Smokeless tobacco: Never Used  . Alcohol use No     Allergies   Ambien [zolpidem]; Aspirin; Codeine; Compazine [prochlorperazine]; Penicillins; and Tramadol   Review of Systems Review of Systems 10 Systems reviewed and are negative for acute change except as noted in the HPI.   Physical Exam Updated Vital Signs BP (!) 193/112 (BP Location: Left Arm)   Pulse 93   Temp 97.5 F (36.4 C) (Oral)   Resp 18   Wt 78 kg   SpO2 100%   Physical Exam  Constitutional: She is oriented to person, place, and time. She appears well-developed and well-nourished. No distress.  HENT:  Head: Normocephalic and atraumatic.  Right Ear: External ear normal.  Left Ear: External ear normal.  Nose: Nose normal.  Mouth/Throat: Oropharynx is clear and moist. No oropharyngeal exudate.  Eyes: Conjunctivae and EOM are normal. Pupils are equal, round, and reactive to light. Right eye exhibits no discharge. Left eye exhibits no discharge. No scleral icterus.  Neck: Normal range of motion. Neck supple. No JVD present. No tracheal deviation present.  Cardiovascular: Normal  rate, regular rhythm and intact distal pulses.   Pulmonary/Chest: Effort normal and breath sounds normal. No stridor. No respiratory distress.  Musculoskeletal: Normal range of motion. She exhibits no tenderness or deformity.       Right hip: Normal.       Left hip: Normal.       Right knee: Normal.       Left knee: Normal.       Right ankle: Normal.       Left ankle: Normal.       Lumbar back: Normal.       Right upper leg: Normal. She exhibits no tenderness, no bony tenderness, no swelling and no edema.       Left upper leg: Normal. She  exhibits no tenderness, no bony tenderness, no swelling and no edema.       Right lower leg: Normal. She exhibits no tenderness, no bony tenderness, no swelling and no edema.       Left lower leg: She exhibits no tenderness, no bony tenderness, no swelling and no edema.  Lymphadenopathy:    She has no cervical adenopathy.  Neurological: She is alert and oriented to person, place, and time. She displays normal reflexes. No sensory deficit. She exhibits normal muscle tone. Coordination normal.  Normal sensation to light touch, 5/5 strength bilaterally of dorsiflexion, plantarflexion, flexion and extension of the knee  Skin: Skin is warm and dry. No rash noted. She is not diaphoretic. No erythema. No pallor.  Psychiatric: She has a normal mood and affect. Her behavior is normal. Judgment and thought content normal.  Nursing note and vitals reviewed.    ED Treatments / Results  Labs (all labs ordered are listed, but only abnormal results are displayed) Labs Reviewed  I-STAT CHEM 8, ED    EKG  EKG Interpretation None       Radiology No results found.  Procedures Procedures (including critical care time)  Medications Ordered in ED Medications - No data to display   Initial Impression / Assessment and Plan / ED Course  I have reviewed the triage vital signs and the nursing notes.  Pertinent labs & imaging results that were available during my care of the patient were reviewed by me and considered in my medical decision making (see chart for details).  Clinical Course    Pt with bilateral calf and hamstring pain, hx is somewhat convoluted, she contradicts herself, for example, stated that pain is most severe first thing in the morning after prolonged immobility, but later stated that pain gets worse with walking around [employer].  It does not sound like anything concerning for claudication.  My impression is that she is narcotic seeking.  There is little information in the chart  through care everywhere, she claims she has narcotic pain medicine from breaking her wrist however this was in 2014, and she cannot state the name or dose of medications. Will obtain chem-8 to check electrolytes.  No concern for MSK injury, soft tissue infection, or compartments soft. She has normal ROM, strength, and is neurovascularly intact.  Will give muscle relaxers, NSAIDS, and encourage stretching exercises and heat/ice tx.  Final Clinical Impressions(s) / ED Diagnoses   Final diagnoses:  Bilateral leg pain    New Prescriptions Discharge Medication List as of 10/05/2016  5:20 PM    START taking these medications   Details  cyclobenzaprine (FLEXERIL) 10 MG tablet Take 1 tablet (10 mg total) by mouth 2 (two) times daily as needed for  muscle spasms., Starting Wed 10/05/2016, Print         Danelle Berry, PA-C 10/15/16 0510    Benjiman Core, MD 10/17/16 [phone]

## 2016-10-05 NOTE — ED Triage Notes (Signed)
She c/o her post. Legs feeling intensely painful upon getting up in the morning, or after any period of inactivity during the day x 1 1/2 months. She is in no distress.

## 2016-10-05 NOTE — Discharge Instructions (Signed)
Take tylenol or ibuprofen as needed for pain.  See your PCP for follow up, you may need evaluation by physical therapy and that must be initiated by your PCP.

## 2018-02-19 DEATH — deceased
